# Patient Record
Sex: Male | Born: 1950 | Race: White | Hispanic: No | Marital: Married
Health system: Southern US, Community
[De-identification: ages and names within clinical notes are randomized; demographics above are authoritative.]

## PROBLEM LIST (undated history)

## (undated) DIAGNOSIS — I10 Essential (primary) hypertension: Secondary | ICD-10-CM

---

## 2020-11-23 ENCOUNTER — Other Ambulatory Visit: Payer: Self-pay

## 2020-11-23 ENCOUNTER — Emergency Department (HOSPITAL_COMMUNITY)
Admission: EM | Admit: 2020-11-23 | Discharge: 2020-11-23 | Disposition: A | Discharge status: Home / Self Care | Payer: Medicare HMO | Attending: Emergency Medicine | Admitting: Emergency Medicine

## 2020-11-23 ENCOUNTER — Emergency Department (HOSPITAL_COMMUNITY): Payer: Medicare HMO

## 2020-11-23 DIAGNOSIS — S0121XA Laceration without foreign body of nose, initial encounter: Secondary | ICD-10-CM | POA: Diagnosis not present

## 2020-11-23 DIAGNOSIS — S022XXA Fracture of nasal bones, initial encounter for closed fracture: Secondary | ICD-10-CM | POA: Insufficient documentation

## 2020-11-23 DIAGNOSIS — S0992XA Unspecified injury of nose, initial encounter: Secondary | ICD-10-CM | POA: Diagnosis present

## 2020-11-23 DIAGNOSIS — S0181XA Laceration without foreign body of other part of head, initial encounter: Secondary | ICD-10-CM

## 2020-11-23 MED ORDER — LIDOCAINE-EPINEPHRINE (PF) 2 %-1:200000 IJ SOLN
20.0000 mL | Freq: Once | INTRAMUSCULAR | Status: AC
  Administered 2020-11-23: 20 mL
  Filled 2020-11-23: qty 20

## 2020-11-23 NOTE — ED Provider Notes (Signed)
[facility] EMERGENCY DEPARTMENT Provider Note   CSN: [id number] Arrival date & time: 11/23/20  1551     History Chief Complaint  Patient presents with   Thrown from horse    Henry Cole is a 70 y.o. male.  Presents to ER after being thrown from horse.  Patient states that he is primarily having facial pain, nose pain.  Has noted some bleeding from inside his nose and top of his nose.  Not on blood thinners.  Denies loss of consciousness.  Has been ambulatory since accident.  Pain is currently mild to moderate.  No alleviating or aggravating factors.  HPI     No past medical history on file.  There are no problems to display for this patient.     No family history on file.     Home Medications Prior to Admission medications   Medication Sig Start Date End Date Taking? Authorizing Provider  Cholecalciferol 25 MCG (1000 UT) tablet Take 1 tablet by mouth at bedtime.   Yes [provider]  Cyanocobalamin 5000 MCG/ML LIQD Place 3 mLs under the tongue daily.   Yes [provider]  meloxicam (MOBIC) 15 MG tablet Take 15 mg by mouth at bedtime. 09/18/20  Yes [provider]  nadolol (CORGARD) 20 MG tablet Take 20 mg by mouth at bedtime. 06/30/20  Yes [provider]    Allergies    Patient has no known allergies.  Review of Systems   Review of Systems  Constitutional:  Negative for chills and fever.  HENT:  Positive for nosebleeds. Negative for ear pain and sore throat.   Eyes:  Negative for pain and visual disturbance.  Respiratory:  Negative for cough and shortness of breath.   Cardiovascular:  Negative for chest pain and palpitations.  Gastrointestinal:  Negative for abdominal pain and vomiting.  Genitourinary:  Negative for dysuria and hematuria.  Musculoskeletal:  Negative for arthralgias and back pain.  Skin:  Negative for color change and rash.  Neurological:  Positive for headaches. Negative for syncope.  All  other systems reviewed and are negative.  Physical Exam Updated Vital Signs BP (!) 146/74   Pulse 70   Temp 97.8 F (36.6 C) (Oral)   Resp 16   Ht 6\' 2"  (1.88 m)   Wt 104.3 kg   SpO2 97%   BMI 29.53 kg/m   Physical Exam Vitals and nursing note reviewed.  Constitutional:      Appearance: He is well-developed.  HENT:     Head: Normocephalic.     Nose:     Comments: Small dried blood in bilateral nares, no active bleeding Generalized swelling to nose, 2 cm laceration across bridge of nose Eyes:     Conjunctiva/sclera: Conjunctivae normal.  Cardiovascular:     Rate and Rhythm: Normal rate and regular rhythm.     Heart sounds: No murmur heard. Pulmonary:     Effort: Pulmonary effort is normal. No respiratory distress.     Breath sounds: Normal breath sounds.  Abdominal:     Palpations: Abdomen is soft.     Tenderness: There is no abdominal tenderness.  Musculoskeletal:     Cervical back: Neck supple.  Skin:    General: Skin is warm and dry.  Neurological:     Mental Status: He is alert.    ED Results / Procedures / Treatments   Labs (all labs ordered are listed, but only abnormal results are displayed) Labs Reviewed - No data to  display  EKG None  Radiology CT Head Wo Contrast  Result Date: 11/23/2020 CLINICAL DATA:  Thrown from horse with facial pain and headaches, initial encounter EXAM: CT HEAD WITHOUT CONTRAST CT MAXILLOFACIAL WITHOUT CONTRAST CT CERVICAL SPINE WITHOUT CONTRAST TECHNIQUE: Multidetector CT imaging of the head, cervical spine, and maxillofacial structures were performed using the standard protocol without intravenous contrast. Multiplanar CT image reconstructions of the cervical spine and maxillofacial structures were also generated. COMPARISON:  None. FINDINGS: CT HEAD FINDINGS Brain: No evidence of acute infarction, hemorrhage, hydrocephalus, extra-axial collection or mass lesion/mass effect. Cavum septum pellucidum is noted which is a normal  variant. Vascular: No hyperdense vessel or unexpected calcification. Skull: Normal. Negative for fracture or focal lesion. Other: Comminuted bilateral nasal bone fractures are noted with mild impaction. CT MAXILLOFACIAL FINDINGS Osseous: Comminuted nasal bone fractures are noted bilaterally with impaction and overlying soft tissue swelling. Mild fracture of the nasal septum is noted with slight deviation to the right. No orbital fracture or sinus wall fractures are seen. No other focal bony abnormality is noted. Orbits: Orbits and their contents are within normal limits. Sinuses: Paranasal sinuses demonstrate mucosal thickening within the ethmoid sinuses and opacification of the right maxillary antrum likely related to the recent injury. Soft tissues: Soft tissue swelling is noted over the nasal bridge. No other focal hematoma is seen. CT CERVICAL SPINE FINDINGS Alignment: Mild straightening of the normal cervical lordosis is noted. This is likely related to muscular spasm. Skull base and vertebrae: 7 cervical segments are well visualized. Vertebral body height is well maintained. Multilevel osteophytic changes are noted throughout the cervical spine. Disc space narrowing at C5-6 and C6-7 is noted. Facet hypertrophic changes are noted. No acute fracture or acute facet abnormality is noted. Soft tissues and spinal canal: Surrounding soft tissue structures show no acute abnormality. There is enlargement and heterogeneity of the left lobe of the thyroid without discrete nodule. There is some intrathoracic extension identified. The inferior most aspect is not included on this exam due to the imaging plane. Upper chest: Lung apices are within normal limits. Other: None IMPRESSION: CT of the head: No acute intracranial abnormality noted. CT of the maxillofacial bones: Comminuted nasal bone fractures bilaterally with fracture along the superior aspect of the nasal septum and mild deviation to the right. Opacification of  the right maxillary antrum is noted related to the recent injury. CT of the cervical spine: Multilevel degenerative change without acute abnormality. Enlargement with heterogeneity in the left lobe of the thyroid with intrathoracic extension. Recommend thyroid US (ref: J Am Coll Radiol. 2015 Feb;12(2): 143-50). Electronically Signed   By: Alcide Clever M.D.   On: 11/23/2020 17:04    Procedures .Marland KitchenLaceration Repair  Date/Time: 11/24/2020 12:16 PM Performed by: Milagros Loll, MD Authorized by: Milagros Loll, MD   Consent:    Consent obtained:  Verbal   Risks, benefits, and alternatives were discussed: yes     Risks discussed:  Infection, poor wound healing, poor cosmetic result, retained foreign body, pain and nerve damage   Alternatives discussed:  No treatment, observation and delayed treatment Universal protocol:    Immediately prior to procedure, a time out was called: yes   Anesthesia:    Anesthesia method:  Local infiltration   Local anesthetic:  Lidocaine 2% WITH epi Laceration details:    Location:  Face   Face location:  Nose   Length (cm):  2 Exploration:    Contaminated: no   Treatment:    Amount  of cleaning:  Extensive   Irrigation solution:  Sterile saline   Irrigation method:  Syringe Skin repair:    Repair method:  Sutures   Suture size:  5-0   Suture material:  Nylon   Suture technique:  Simple interrupted   Number of sutures:  2 Approximation:    Approximation:  Close Repair type:    Repair type:  Simple Post-procedure details:    Dressing:  Open (no dressing)   Procedure completion:  Tolerated well, no immediate complications   Medications Ordered in ED Medications  lidocaine-EPINEPHrine (XYLOCAINE W/EPI) 2 %-1:200000 (PF) injection 20 mL (20 mLs Infiltration Given 11/23/20 1702)    ED Course  I have reviewed the triage vital signs and the nursing notes.  Pertinent labs & imaging results that were available during my care of the patient were  reviewed by me and considered in my medical decision making (see chart for details).    MDM Rules/Calculators/A&P                          70 year old male presents to ER after facial injury from being thrown off horse.  On exam patient noted to have small laceration across the bridge of his nose.  CT imaging negative for any intracranial pathology.  Did demonstrate nasal bone fracture.  CT C-spine negative.  Did demonstrate heterogeneous thyroid.  Discussed with patient, recommended follow-up with primary doctor and request outpatient thyroid ultrasound.  For his nasal laceration, performed bedside repair.  Regarding the epistaxis.  This resolved with direct pressure.  Instructed patient to follow-up with ENT for further management of his nasal bone fracture.  Discussed patient may need additional procedures.  Recommended suture removal in approximately 1 week.  Discharged.   After the discussed management above, the patient was determined to be safe for discharge.  The patient was in agreement with this plan and all questions regarding their care were answered.  ED return precautions were discussed and the patient will return to the ED with any significant worsening of condition.  Final Clinical Impression(s) / ED Diagnoses Final diagnoses:  Closed fracture of nasal bone, initial encounter  Facial laceration, initial encounter    Rx / DC Orders ED Discharge Orders     None        Milagros Loll, MD 11/24/20 1217

## 2020-11-23 NOTE — ED Provider Notes (Signed)
Emergency Medicine Provider Triage Evaluation Note  Henry Cole , a 70 y.o. male  was evaluated in triage.  Pt complains of facial pain after being thrown off a horse just prior to arrival. Patient admits to hitting his nose causing bilateral epistaxis. No LOC. He admits to taking ASA 81mg  occasionally, last took it roughly 2 weeks ago. He endorses nose and upper lip pain. Last tetanus was within the past 5 years. He states after the accident, he was ambulatory at the scene. Denies low back pain, chest pain, nausea, vomiting, abdominal pain.  Review of Systems  Positive: wound Negative: Back pain  Physical Exam  BP (!) 167/93 (BP Location: Right Arm)   Pulse 72   Temp 97.8 F (36.6 C) (Oral)   Resp 16   SpO2 97%  Gen:   Awake, no distress   Resp:  Normal effort  MSK:   Moves extremities without difficulty  Other:  Laceration to nose with bilateral epistaxis. No hemotympanum. Equal grip strength. No cervical, thoracic, or lumbar midline tenderness.  Medical Decision Making  Medically screening exam initiated at 4:00 PM.  Appropriate orders placed.  Ruairi Stutsman was informed that the remainder of the evaluation will be completed by another provider, this initial triage assessment does not replace that evaluation, and the importance of remaining in the ED until their evaluation is complete.  CT head, maxillofacial, and cervical neck ordered CXR and pelvis x-ray ordered due to trauma and distracting injury and unable to perform full PE in triage.  Patient brought back to room 31 from triage.    Leane Call, PA-C 11/23/20 1604    11/25/20, DO 11/24/20 [phone]

## 2020-11-23 NOTE — ED Notes (Signed)
Patient transported to CT 

## 2020-11-23 NOTE — ED Triage Notes (Signed)
Pt thrown from horse when horse stumbled. Obvious deformity noted to nose, bleeding controled in triage. Pt not on thinners, denies LOC, ambulatory on scene & able to extricate self from car on arrival. Pt A&O4 on arrival, GCS 15. CMS intact in all 4 extremities, NAD in triage

## 2020-11-23 NOTE — Discharge Instructions (Addendum)
The radiologist recommends obtaining a thyroid ultrasound.  Your primary doctor can arrange this.  This is to ensure you do not have a thyroid mass.  Regarding your broken nose, please schedule follow-up with an ear nose and throat specialist.  You may need a procedure.  Your sutures on your nose should be removed in approximately 1 week.  You can have this done in the ENT office, primary care doctor or come back to ER.  For any nosebleeding, apply direct pressure.  If direct pressure does not stop the bleeding after 15 to 20 minutes, come back to ER for reassessment and further management.

## 2021-12-27 IMAGING — CT CT HEAD W/O CM
3 of 4 series · 13 of 47 positions shown, 15 images · non-contrast
Comparison: None.

CLINICAL DATA: Thrown from horse with facial pain and headaches,
initial encounter

EXAM:
CT HEAD WITHOUT CONTRAST
CT MAXILLOFACIAL WITHOUT CONTRAST
CT CERVICAL SPINE WITHOUT CONTRAST
TECHNIQUE: Multidetector CT imaging of the head, cervical spine, and
maxillofacial structures were performed using the standard protocol
without intravenous contrast. Multiplanar CT image reconstructions
of the cervical spine and maxillofacial structures were also
generated.

[Series 3: head without · axial · non-contrast · 0.44mm/px · z∈[-159,-34]mm · 7 of 35 slices shown, 9 images]
[im 5/35  brain]
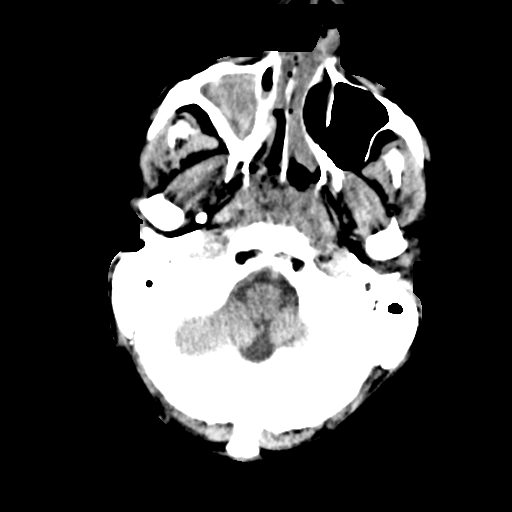
[im 5/35  bone]
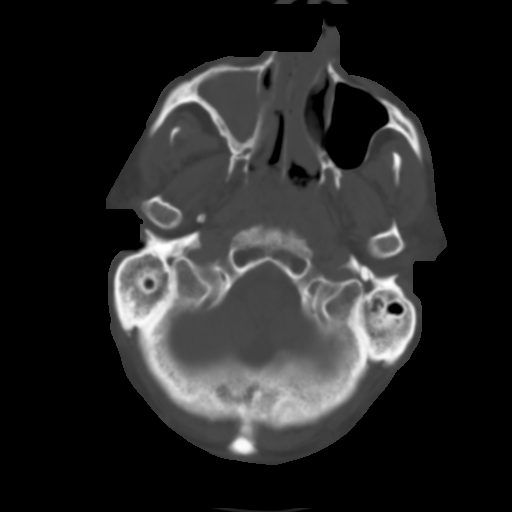
[im 9/35  brain]
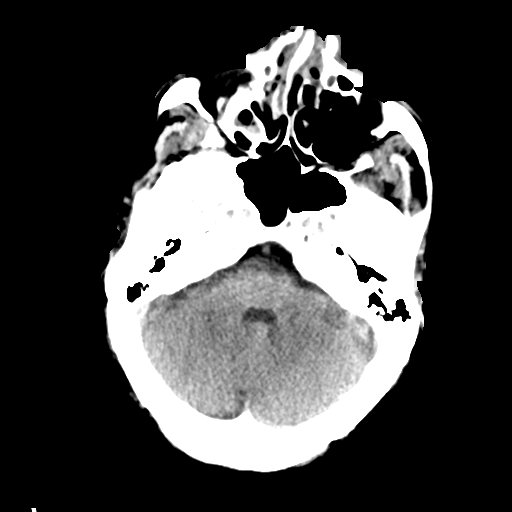
[im 13/35  brain]
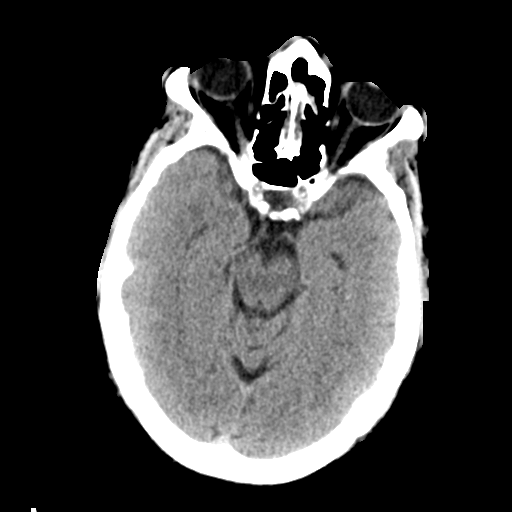
[im 18/35  brain]
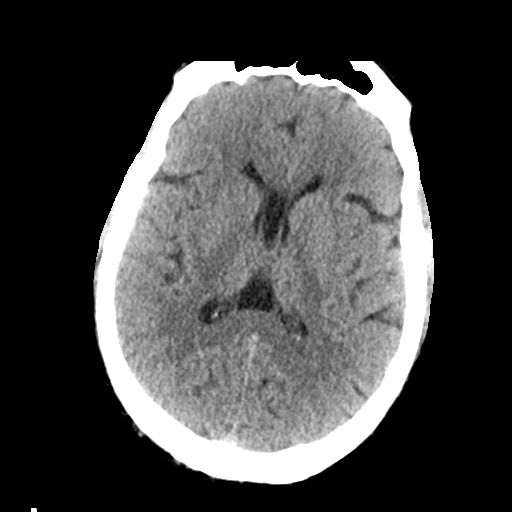
[im 22/35  brain]
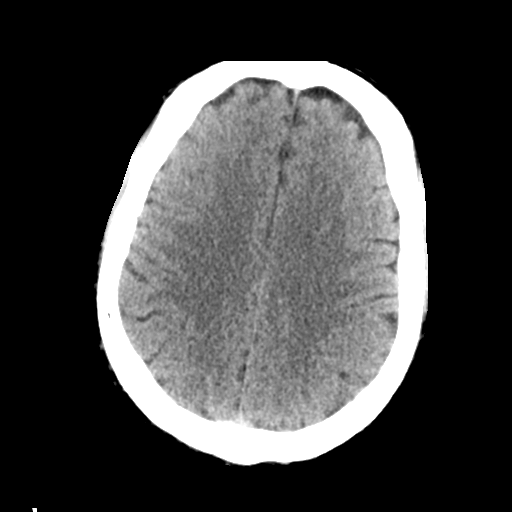
[im 22/35  bone]
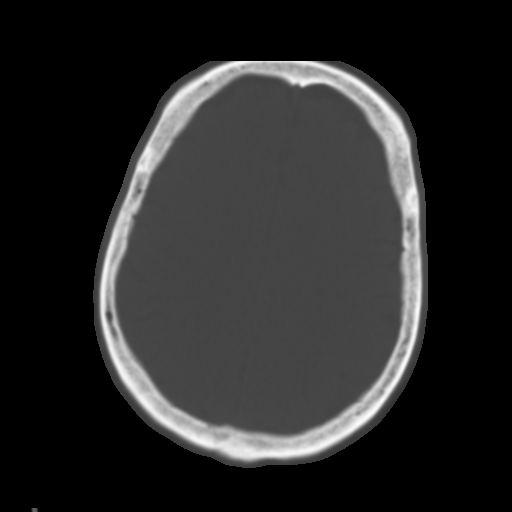
[im 26/35  brain]
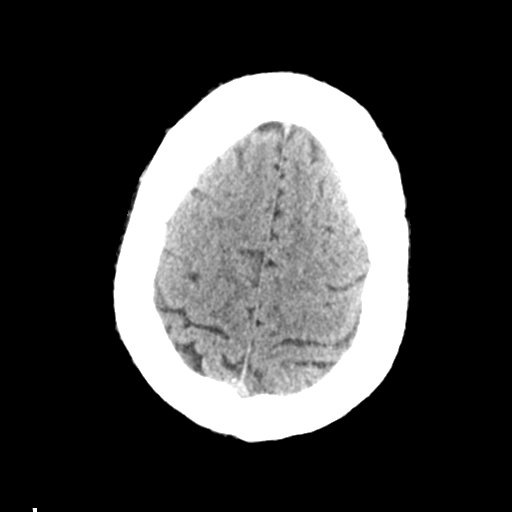
[im 30/35  brain]
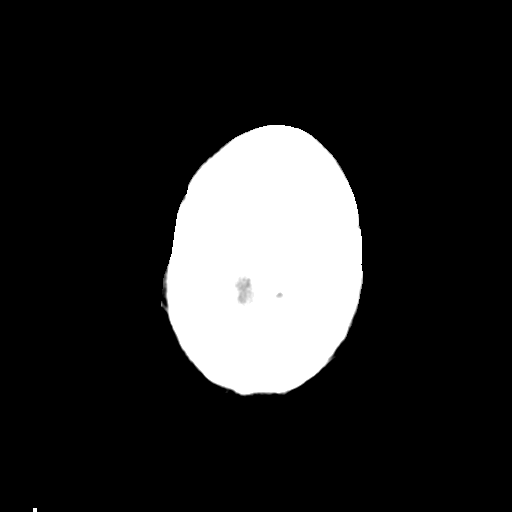

[Series 5: head without cor · coronal · non-contrast · 0.33mm/px · 3 of 75 slices shown]
[im 25/75  brain]
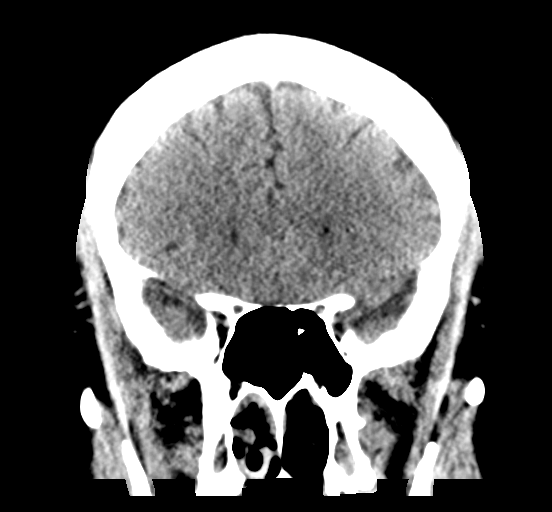
[im 33/75  brain]
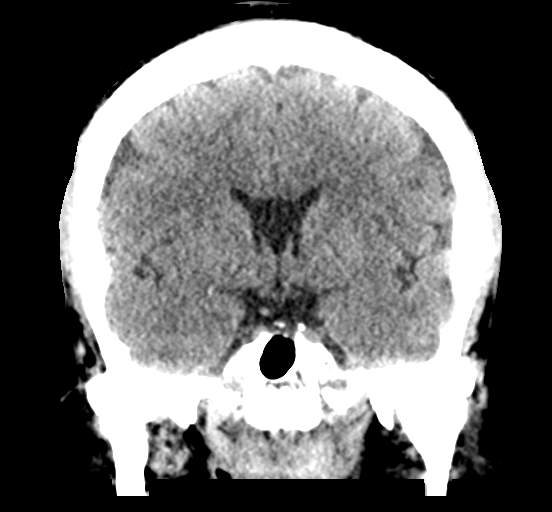
[im 42/75  brain]
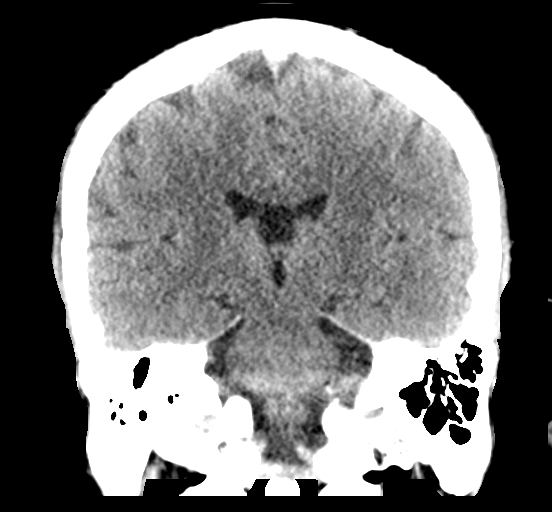

[Series 6: head without sag · sagittal · non-contrast · 0.33mm/px · 3 of 59 slices shown]
[im 20/59  brain]
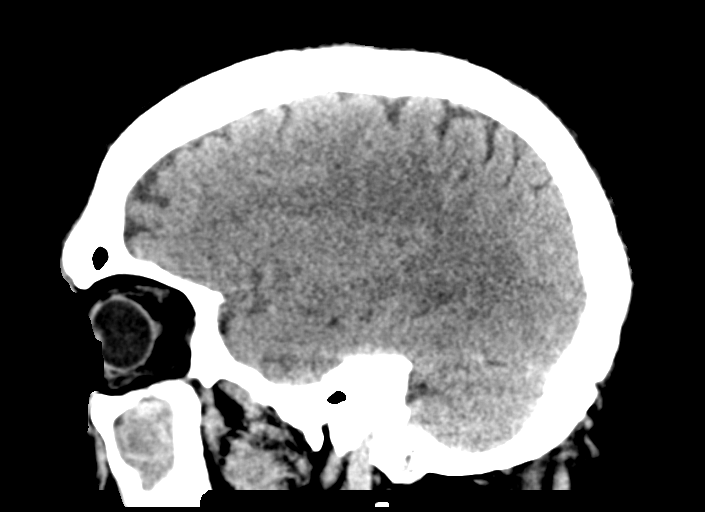
[im 30/59  brain]
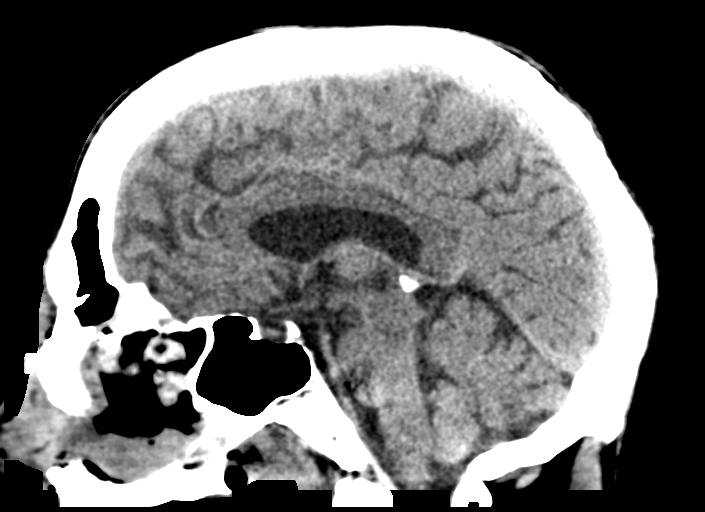
[im 39/59  brain]
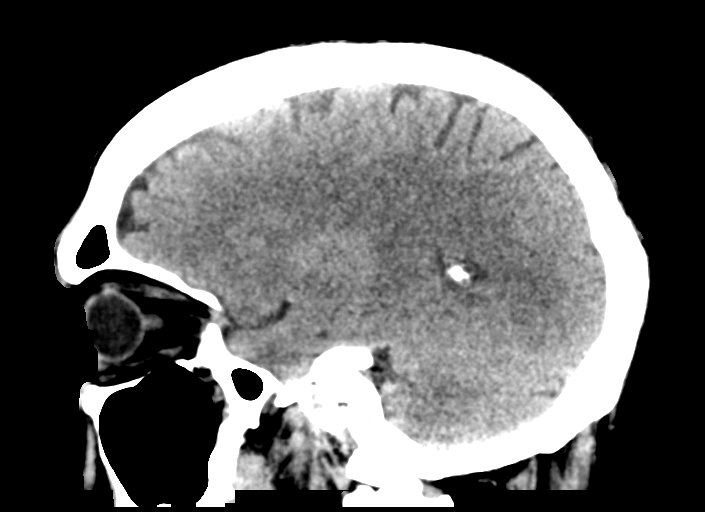

[13 of 47 positions shown; findings below may reference images not displayed]

FINDINGS: CT HEAD FINDINGS

Brain: No evidence of acute infarction, hemorrhage, hydrocephalus,
extra-axial collection or mass lesion/mass effect. Cavum septum
pellucidum is noted which is a normal variant.

Vascular: No hyperdense vessel or unexpected calcification.

Skull: Normal. Negative for fracture or focal lesion.

Other: Comminuted bilateral nasal bone fractures are noted with mild
impaction.

CT MAXILLOFACIAL FINDINGS

Osseous: Comminuted nasal bone fractures are noted bilaterally with
impaction and overlying soft tissue swelling. Mild fracture of the
nasal septum is noted with slight deviation to the right. No orbital
fracture or sinus wall fractures are seen. No other focal bony
abnormality is noted.

Orbits: Orbits and their contents are within normal limits.

Sinuses: Paranasal sinuses demonstrate mucosal thickening within the
ethmoid sinuses and opacification of the right maxillary antrum
likely related to the recent injury.

Soft tissues: Soft tissue swelling is noted over the nasal bridge.
No other focal hematoma is seen.

CT CERVICAL SPINE FINDINGS

Alignment: Mild straightening of the normal cervical lordosis is
noted. This is likely related to muscular spasm.

Skull base and vertebrae: 7 cervical segments are well visualized.
Vertebral body height is well maintained. Multilevel osteophytic
changes are noted throughout the cervical spine. Disc space
narrowing at C5-6 and C6-7 is noted. Facet hypertrophic changes are
noted. No acute fracture or acute facet abnormality is noted.

Soft tissues and spinal canal: Surrounding soft tissue structures
show no acute abnormality. There is enlargement and heterogeneity of
the left lobe of the thyroid without discrete nodule. There is some
intrathoracic extension identified. The inferior most aspect is not
included on this exam due to the imaging plane.

Upper chest: Lung apices are within normal limits.

Other: None
IMPRESSION: CT of the head: No acute intracranial abnormality noted.

CT of the maxillofacial bones: Comminuted nasal bone fractures
bilaterally with fracture along the superior aspect of the nasal
septum and mild deviation to the right.

Opacification of the right maxillary antrum is noted related to the
recent injury.

CT of the cervical spine: Multilevel degenerative change without
acute abnormality.

Enlargement with heterogeneity in the left lobe of the thyroid with
intrathoracic extension. Recommend thyroid US (ref: [HOSPITAL]. [DATE]): 143-50).

## 2023-05-04 ENCOUNTER — Emergency Department (HOSPITAL_COMMUNITY): Payer: Medicare HMO

## 2023-05-04 ENCOUNTER — Emergency Department (HOSPITAL_COMMUNITY)
Admission: EM | Admit: 2023-05-04 | Discharge: 2023-05-04 | Disposition: A | Discharge status: Home / Self Care | Payer: Medicare HMO | Attending: Emergency Medicine | Admitting: Emergency Medicine

## 2023-05-04 ENCOUNTER — Encounter (HOSPITAL_COMMUNITY): Payer: Self-pay | Admitting: Emergency Medicine

## 2023-05-04 ENCOUNTER — Other Ambulatory Visit: Payer: Self-pay

## 2023-05-04 DIAGNOSIS — S2020XA Contusion of thorax, unspecified, initial encounter: Secondary | ICD-10-CM

## 2023-05-04 DIAGNOSIS — W231XXA Caught, crushed, jammed, or pinched between stationary objects, initial encounter: Secondary | ICD-10-CM | POA: Insufficient documentation

## 2023-05-04 DIAGNOSIS — R079 Chest pain, unspecified: Secondary | ICD-10-CM | POA: Insufficient documentation

## 2023-05-04 DIAGNOSIS — R030 Elevated blood-pressure reading, without diagnosis of hypertension: Secondary | ICD-10-CM

## 2023-05-04 DIAGNOSIS — S300XXA Contusion of lower back and pelvis, initial encounter: Secondary | ICD-10-CM | POA: Diagnosis not present

## 2023-05-04 DIAGNOSIS — S3992XA Unspecified injury of lower back, initial encounter: Secondary | ICD-10-CM | POA: Diagnosis present

## 2023-05-04 DIAGNOSIS — S20219A Contusion of unspecified front wall of thorax, initial encounter: Secondary | ICD-10-CM | POA: Insufficient documentation

## 2023-05-04 DIAGNOSIS — M542 Cervicalgia: Secondary | ICD-10-CM | POA: Diagnosis not present

## 2023-05-04 DIAGNOSIS — R519 Headache, unspecified: Secondary | ICD-10-CM | POA: Diagnosis not present

## 2023-05-04 HISTORY — DX: Essential (primary) hypertension: I10

## 2023-05-04 LAB — COMPREHENSIVE METABOLIC PANEL
ALT: 37 U/L (ref 0–44)
AST: 28 U/L (ref 15–41)
Albumin: 3.6 g/dL (ref 3.5–5.0)
Alkaline Phosphatase: 71 U/L (ref 38–126)
Anion gap: 8 (ref 5–15)
BUN: 11 mg/dL (ref 8–23)
CO2: 25 mmol/L (ref 22–32)
Calcium: 9 mg/dL (ref 8.9–10.3)
Chloride: 102 mmol/L (ref 98–111)
Creatinine, Ser: 0.63 mg/dL (ref 0.61–1.24)
GFR, Estimated: >60 mL/min (ref >60)
Glucose, Bld: 148 mg/dL — ABNORMAL HIGH (ref 70–99)
Potassium: 4.2 mmol/L (ref 3.5–5.1)
Sodium: 135 mmol/L (ref 135–145)
Total Bilirubin: 1.1 mg/dL (ref <1.2)
Total Protein: 6.4 g/dL — ABNORMAL LOW (ref 6.5–8.1)

## 2023-05-04 LAB — I-STAT CG4 LACTIC ACID, ED: Lactic Acid, Venous: 0.8 mmol/L (ref 0.5–1.9)

## 2023-05-04 LAB — CBC
HCT: 49.1 % (ref 39.0–52.0)
Hemoglobin: 17.1 g/dL — ABNORMAL HIGH (ref 13.0–17.0)
MCH: 32.3 pg (ref 26.0–34.0)
MCHC: 34.8 g/dL (ref 30.0–36.0)
MCV: 92.8 fL (ref 80.0–100.0)
Platelets: 147 10*3/uL — ABNORMAL LOW (ref 150–400)
RBC: 5.29 MIL/uL (ref 4.22–5.81)
RDW: 12.2 % (ref 11.5–15.5)
WBC: 6.5 10*3/uL (ref 4.0–10.5)
nRBC: 0 % (ref 0.0–0.2)

## 2023-05-04 LAB — I-STAT CHEM 8, ED
BUN: 13 mg/dL (ref 8–23)
Calcium, Ion: 1.14 mmol/L — ABNORMAL LOW (ref 1.15–1.40)
Chloride: 102 mmol/L (ref 98–111)
Creatinine, Ser: 0.7 mg/dL (ref 0.61–1.24)
Glucose, Bld: 147 mg/dL — ABNORMAL HIGH (ref 70–99)
HCT: 49 % (ref 39.0–52.0)
Hemoglobin: 16.7 g/dL (ref 13.0–17.0)
Potassium: 4.2 mmol/L (ref 3.5–5.1)
Sodium: 137 mmol/L (ref 135–145)
TCO2: 26 mmol/L (ref 22–32)

## 2023-05-04 LAB — PROTIME-INR
INR: 1 (ref 0.8–1.2)
Prothrombin Time: 13.6 s (ref 11.4–15.2)

## 2023-05-04 LAB — CK: Total CK: 254 U/L (ref 49–397)

## 2023-05-04 MED ORDER — KETOROLAC TROMETHAMINE 15 MG/ML IJ SOLN
15.0000 mg | Freq: Once | INTRAMUSCULAR | Status: AC
  Administered 2023-05-04: 15 mg via INTRAVENOUS
  Filled 2023-05-04: qty 1

## 2023-05-04 MED ORDER — TRAMADOL HCL 50 MG PO TABS: 50.0000 mg | ORAL_TABLET | Freq: Four times a day (QID) | ORAL | 0 refills | Status: AC | PRN

## 2023-05-04 MED ORDER — ONDANSETRON HCL 4 MG/2ML IJ SOLN
4.0000 mg | Freq: Once | INTRAMUSCULAR | Status: AC
  Administered 2023-05-04: 4 mg via INTRAVENOUS
  Filled 2023-05-04: qty 2

## 2023-05-04 MED ORDER — HYDROMORPHONE HCL 1 MG/ML IJ SOLN
1.0000 mg | Freq: Once | INTRAMUSCULAR | Status: AC
  Administered 2023-05-04: 1 mg via INTRAVENOUS
  Filled 2023-05-04: qty 1

## 2023-05-04 MED ORDER — IOHEXOL 350 MG/ML SOLN
75.0000 mL | Freq: Once | INTRAVENOUS | Status: AC | PRN
  Administered 2023-05-04: 75 mL via INTRAVENOUS

## 2023-05-04 MED ORDER — METHOCARBAMOL 750 MG PO TABS
750.0000 mg | ORAL_TABLET | Freq: Three times a day (TID) | ORAL | 0 refills | Status: AC | PRN
Start: 2023-05-04 — End: ?

## 2023-05-04 NOTE — ED Notes (Signed)
Trauma Event Note  Nonactivated trauma- incident happened yesterday -- was moving large hay bales- large round bale rolled against him, pinning him against the stall door in the barn-- hurts to move, breathe, difficulty walking, but states it is from the pain in his right flank, upper back area. Tender over T-spine and right posterior ribs.  Orders started at triage.   Last imported Vital Signs BP (!) 154/104   Pulse 90   Temp 98 F (36.7 C) (Oral)   Resp 18   Ht 6\' 3"  (1.905 m)   Wt 247 lb (112 kg)   SpO2 96%   BMI 30.87 kg/m   Trending CBC No results for input(s): "WBC", "HGB", "HCT", "PLT" in the last 72 hours.  Trending Coag's No results for input(s): "APTT", "INR" in the last 72 hours.  Trending BMET No results for input(s): "NA", "K", "CL", "CO2", "BUN", "CREATININE", "GLUCOSE" in the last 72 hours.    Henry Cole  Trauma Response RN  Please call TRN at [phone] for further assistance.

## 2023-05-04 NOTE — ED Provider Notes (Signed)
[place] EMERGENCY DEPARTMENT AT [facility] Provider Note   CSN: [id number] Arrival date & time: 05/04/23  1305     History  Chief Complaint  Patient presents with   Trauma    Henry Cole is a 72 y.o. male.  Pt s/p injury to back and right flank yesterday. Indicates was knocked back into a stall door and latch by a large bale of hay. No loc. Was ambulatory since. C/o lower back and lower right flank pain posteriorly, constant, dull, severe, non radiating. No radicular pain. No saddle area or leg numbness. No weakness. No anterior pain, no chest pain or abd pain. No head injury or loc. No headache. No neck pain. No anticoagulant use.  Has tried otc meds without relief.   The history is provided by the patient, the spouse and medical records.  Trauma   Current symptoms:      Associated symptoms:            Reports back pain.            Denies chest pain, headache, nausea, neck pain and vomiting.       Home Medications Prior to Admission medications   Medication Sig Start Date End Date Taking? Authorizing Provider  Cholecalciferol 25 MCG (1000 UT) tablet Take 1 tablet by mouth at bedtime.    [provider]  Cyanocobalamin 5000 MCG/ML LIQD Place 3 mLs under the tongue daily.    [provider]  meloxicam (MOBIC) 15 MG tablet Take 15 mg by mouth at bedtime. 09/18/20   [provider]  nadolol (CORGARD) 20 MG tablet Take 20 mg by mouth at bedtime. 06/30/20   [provider]      Allergies    Patient has no known allergies.    Review of Systems   Review of Systems  Constitutional:  Negative for fever.  HENT:  Negative for nosebleeds.   Eyes:  Negative for pain and redness.  Respiratory:  Negative for shortness of breath.   Cardiovascular:  Negative for chest pain.  Gastrointestinal:  Negative for nausea and vomiting.  Genitourinary:  Positive for flank pain. Negative for dysuria and hematuria.  Musculoskeletal:  Positive  for back pain. Negative for neck pain.  Skin:  Negative for wound.  Neurological:  Negative for weakness, numbness and headaches.  Hematological:  Does not bruise/bleed easily.  Psychiatric/Behavioral:  Negative for confusion.     Physical Exam Updated Vital Signs BP 136/80   Pulse 63   Temp 98 F (36.7 C) (Oral)   Resp 19   Ht 1.905 m (6\' 3" )   Wt 112 kg   SpO2 97%   BMI 30.87 kg/m  Physical Exam Vitals and nursing note reviewed.  Constitutional:      Appearance: Normal appearance. He is well-developed.  HENT:     Head: Atraumatic.     Nose: Nose normal.     Mouth/Throat:     Mouth: Mucous membranes are moist.     Pharynx: Oropharynx is clear.  Eyes:     General: No scleral icterus.    Conjunctiva/sclera: Conjunctivae normal.     Pupils: Pupils are equal, round, and reactive to light.  Neck:     Trachea: No tracheal deviation.  Cardiovascular:     Rate and Rhythm: Normal rate and regular rhythm.     Pulses: Normal pulses.     Heart sounds: Normal heart sounds. No murmur heard.    No friction rub. No gallop.  Pulmonary:     Effort: Pulmonary effort is normal. No accessory muscle usage or respiratory distress.     Breath sounds: Normal breath sounds.  Chest:     Chest wall: No tenderness.  Abdominal:     General: Bowel sounds are normal. There is no distension.     Palpations: Abdomen is soft.     Tenderness: There is no abdominal tenderness.     Comments: No abd contusion or bruising.   Genitourinary:    Comments: No cva tenderness. Musculoskeletal:        General: No swelling.     Cervical back: Normal range of motion and neck supple. No rigidity or tenderness.     Comments: Mid to upper lumbar tenderness, and left lower back and flank tenderness, otherwise, CTLS spine, non tender, aligned, no step off. Good rom bil extremities without pain or focal bony tenderness.   Skin:    General: Skin is warm and dry.     Coloration: Jaundice: .qaz.     Findings: No  rash.  Neurological:     Mental Status: He is alert.     Comments: GCS 15. Alert, speech clear. Motor/sens grossly intact bil.   Psychiatric:        Mood and Affect: Mood normal.     ED Results / Procedures / Treatments   Labs (all labs ordered are listed, but only abnormal results are displayed) Results for orders placed or performed during the hospital encounter of 05/04/23  Comprehensive metabolic panel   Collection Time: 05/04/23  1:42 PM  Result Value Ref Range   Sodium 135 135 - 145 mmol/L   Potassium 4.2 3.5 - 5.1 mmol/L   Chloride 102 98 - 111 mmol/L   CO2 25 22 - 32 mmol/L   Glucose, Bld 148 (H) 70 - 99 mg/dL   BUN 11 8 - 23 mg/dL   Creatinine, Ser 6.96 0.61 - 1.24 mg/dL   Calcium 9.0 8.9 - 29.5 mg/dL   Total Protein 6.4 (L) 6.5 - 8.1 g/dL   Albumin 3.6 3.5 - 5.0 g/dL   AST 28 15 - 41 U/L   ALT 37 0 - 44 U/L   Alkaline Phosphatase 71 38 - 126 U/L   Total Bilirubin 1.1 <1.2 mg/dL   GFR, Estimated >28 >41 mL/min   Anion gap 8 5 - 15  CBC   Collection Time: 05/04/23  1:42 PM  Result Value Ref Range   WBC 6.5 4.0 - 10.5 K/uL   RBC 5.29 4.22 - 5.81 MIL/uL   Hemoglobin 17.1 (H) 13.0 - 17.0 g/dL   HCT 32.4 40.1 - 02.7 %   MCV 92.8 80.0 - 100.0 fL   MCH 32.3 26.0 - 34.0 pg   MCHC 34.8 30.0 - 36.0 g/dL   RDW 25.3 66.4 - 40.3 %   Platelets 147 (L) 150 - 400 K/uL   nRBC 0.0 0.0 - 0.2 %  Protime-INR   Collection Time: 05/04/23  1:42 PM  Result Value Ref Range   Prothrombin Time 13.6 11.4 - 15.2 seconds   INR 1.0 0.8 - 1.2  CK   Collection Time: 05/04/23  1:42 PM  Result Value Ref Range   Total CK 254 49 - 397 U/L  I-Stat Lactic Acid, ED   Collection Time: 05/04/23  1:49 PM  Result Value Ref Range   Lactic Acid, Venous 0.8 0.5 - 1.9 mmol/L  I-Stat Chem 8, ED   Collection Time: 05/04/23  1:53 PM  Result  Value Ref Range   Sodium 137 135 - 145 mmol/L   Potassium 4.2 3.5 - 5.1 mmol/L   Chloride 102 98 - 111 mmol/L   BUN 13 8 - 23 mg/dL   Creatinine, Ser 0.27  0.61 - 1.24 mg/dL   Glucose, Bld 253 (H) 70 - 99 mg/dL   Calcium, Ion 6.64 (L) 1.15 - 1.40 mmol/L   TCO2 26 22 - 32 mmol/L   Hemoglobin 16.7 13.0 - 17.0 g/dL   HCT 40.3 47.4 - 25.9 %   CT CHEST ABDOMEN PELVIS W CONTRAST Result Date: 05/04/2023 CLINICAL DATA:  Crushing injury yesterday, back and rib pain EXAM: CT CHEST, ABDOMEN, AND PELVIS WITH CONTRAST CT LUMBAR SPINE WITH CONTRAST TECHNIQUE: Multidetector CT imaging of the chest, abdomen and pelvis was performed following the standard protocol during bolus administration of intravenous contrast. Multidetector CT imaging of the lumbar spine was performed following the standard protocol during bolus administration of intravenous contrast. RADIATION DOSE REDUCTION: This exam was performed according to the departmental dose-optimization program which includes automated exposure control, adjustment of the mA and/or kV according to patient size and/or use of iterative reconstruction technique. CONTRAST:  75mL OMNIPAQUE IOHEXOL 350 MG/ML SOLN COMPARISON:  None Available. FINDINGS: CT CHEST FINDINGS Cardiovascular: No significant vascular findings. Normal heart size. Three-vessel coronary artery calcifications no pericardial effusion. Mediastinum/Nodes: No enlarged mediastinal, hilar, or axillary lymph nodes. Trachea and esophagus demonstrate no significant findings. Lungs/Pleura: Scarring and or atelectasis of the left-greater-than-right lung bases with elevation of the left hemidiaphragm. Musculoskeletal: No chest wall mass or suspicious osseous lesions identified. CT ABDOMEN PELVIS FINDINGS Hepatobiliary: No solid liver abnormality is seen. Simple liver cysts, for which no further follow-up or characterization is required. No gallstones, gallbladder wall thickening, or biliary dilatation. Pancreas: Unremarkable. No pancreatic ductal dilatation or surrounding inflammatory changes. Spleen: Normal in size without significant abnormality. Adrenals/Urinary Tract:  Adrenal glands are unremarkable. Simple, benign left renal cortical cysts, for which no further follow-up or characterization is required. Nonobstructive left renal calculi. Bladder is unremarkable. Stomach/Bowel: Stomach is within normal limits. Appendix appears normal. No evidence of bowel wall thickening, distention, or inflammatory changes. Vascular/Lymphatic: Scattered aortic atherosclerosis. No enlarged abdominal or pelvic lymph nodes. Reproductive: No mass or other abnormality. Other: Small fat containing bilateral inguinal hernias.  No ascites. Musculoskeletal: No acute osseous findings. CT LUMBAR SPINE FINDINGS Alignment: Degenerative straightening of the normal lumbar lordosis. Vertebral bodies: Intact. No fracture or dislocation. Disc spaces: Moderate disc space height loss and osteophytosis throughout the lumbar spine. Paraspinous soft tissues: Unremarkable. IMPRESSION: 1. No CT evidence of acute traumatic injury to the chest, abdomen, or pelvis. 2. No fracture or dislocation of the lumbar spine. 3. Nonobstructive left nephrolithiasis. 4. Coronary artery disease. Aortic Atherosclerosis (ICD10-I70.0). Electronically Signed   By: Jearld Lesch M.D.   On: 05/04/2023 16:11   CT L-SPINE NO CHARGE Result Date: 05/04/2023 CLINICAL DATA:  Crushing injury yesterday, back and rib pain EXAM: CT CHEST, ABDOMEN, AND PELVIS WITH CONTRAST CT LUMBAR SPINE WITH CONTRAST TECHNIQUE: Multidetector CT imaging of the chest, abdomen and pelvis was performed following the standard protocol during bolus administration of intravenous contrast. Multidetector CT imaging of the lumbar spine was performed following the standard protocol during bolus administration of intravenous contrast. RADIATION DOSE REDUCTION: This exam was performed according to the departmental dose-optimization program which includes automated exposure control, adjustment of the mA and/or kV according to patient size and/or use of iterative reconstruction  technique. CONTRAST:  75mL OMNIPAQUE IOHEXOL 350 MG/ML SOLN  COMPARISON:  None Available. FINDINGS: CT CHEST FINDINGS Cardiovascular: No significant vascular findings. Normal heart size. Three-vessel coronary artery calcifications no pericardial effusion. Mediastinum/Nodes: No enlarged mediastinal, hilar, or axillary lymph nodes. Trachea and esophagus demonstrate no significant findings. Lungs/Pleura: Scarring and or atelectasis of the left-greater-than-right lung bases with elevation of the left hemidiaphragm. Musculoskeletal: No chest wall mass or suspicious osseous lesions identified. CT ABDOMEN PELVIS FINDINGS Hepatobiliary: No solid liver abnormality is seen. Simple liver cysts, for which no further follow-up or characterization is required. No gallstones, gallbladder wall thickening, or biliary dilatation. Pancreas: Unremarkable. No pancreatic ductal dilatation or surrounding inflammatory changes. Spleen: Normal in size without significant abnormality. Adrenals/Urinary Tract: Adrenal glands are unremarkable. Simple, benign left renal cortical cysts, for which no further follow-up or characterization is required. Nonobstructive left renal calculi. Bladder is unremarkable. Stomach/Bowel: Stomach is within normal limits. Appendix appears normal. No evidence of bowel wall thickening, distention, or inflammatory changes. Vascular/Lymphatic: Scattered aortic atherosclerosis. No enlarged abdominal or pelvic lymph nodes. Reproductive: No mass or other abnormality. Other: Small fat containing bilateral inguinal hernias.  No ascites. Musculoskeletal: No acute osseous findings. CT LUMBAR SPINE FINDINGS Alignment: Degenerative straightening of the normal lumbar lordosis. Vertebral bodies: Intact. No fracture or dislocation. Disc spaces: Moderate disc space height loss and osteophytosis throughout the lumbar spine. Paraspinous soft tissues: Unremarkable. IMPRESSION: 1. No CT evidence of acute traumatic injury to the chest,  abdomen, or pelvis. 2. No fracture or dislocation of the lumbar spine. 3. Nonobstructive left nephrolithiasis. 4. Coronary artery disease. Aortic Atherosclerosis (ICD10-I70.0). Electronically Signed   By: Jearld Lesch M.D.   On: 05/04/2023 16:11   DG Pelvis 1-2 Views Result Date: 05/04/2023 CLINICAL DATA:  Crush injury.  Pain. EXAM: PELVIS - 1-2 VIEW COMPARISON:  None Available. FINDINGS: There is no evidence of pelvic fracture or diastasis. No pelvic bone lesions are seen. Degenerative changes noted lower lumbar spine. IMPRESSION: Negative. Electronically Signed   By: Kennith Center M.D.   On: 05/04/2023 14:33   DG Chest 1 View Result Date: 05/04/2023 CLINICAL DATA:  [id number] Pain [id number], barn door crush injury yesterday EXAM: CHEST  1 VIEW COMPARISON:  11/23/2020 chest radiograph. FINDINGS: Chronic prominent elevation of the left hemidiaphragm. Stable cardiomediastinal silhouette with normal heart size. No pneumothorax. No pleural effusion. No pulmonary edema. Mild passive left lung base atelectasis. No acute consolidative airspace disease. No displaced fractures in the visualized chest. IMPRESSION: Chronic prominent elevation of the left hemidiaphragm with mild passive left lung base atelectasis. Otherwise no acute cardiopulmonary disease. Electronically Signed   By: Delbert Phenix M.D.   On: 05/04/2023 14:32    EKG None  Radiology CT CHEST ABDOMEN PELVIS W CONTRAST Result Date: 05/04/2023 CLINICAL DATA:  Crushing injury yesterday, back and rib pain EXAM: CT CHEST, ABDOMEN, AND PELVIS WITH CONTRAST CT LUMBAR SPINE WITH CONTRAST TECHNIQUE: Multidetector CT imaging of the chest, abdomen and pelvis was performed following the standard protocol during bolus administration of intravenous contrast. Multidetector CT imaging of the lumbar spine was performed following the standard protocol during bolus administration of intravenous contrast. RADIATION DOSE REDUCTION: This exam was performed according to the  departmental dose-optimization program which includes automated exposure control, adjustment of the mA and/or kV according to patient size and/or use of iterative reconstruction technique. CONTRAST:  75mL OMNIPAQUE IOHEXOL 350 MG/ML SOLN COMPARISON:  None Available. FINDINGS: CT CHEST FINDINGS Cardiovascular: No significant vascular findings. Normal heart size. Three-vessel coronary artery calcifications no pericardial effusion. Mediastinum/Nodes: No enlarged mediastinal, hilar, or axillary lymph  nodes. Trachea and esophagus demonstrate no significant findings. Lungs/Pleura: Scarring and or atelectasis of the left-greater-than-right lung bases with elevation of the left hemidiaphragm. Musculoskeletal: No chest wall mass or suspicious osseous lesions identified. CT ABDOMEN PELVIS FINDINGS Hepatobiliary: No solid liver abnormality is seen. Simple liver cysts, for which no further follow-up or characterization is required. No gallstones, gallbladder wall thickening, or biliary dilatation. Pancreas: Unremarkable. No pancreatic ductal dilatation or surrounding inflammatory changes. Spleen: Normal in size without significant abnormality. Adrenals/Urinary Tract: Adrenal glands are unremarkable. Simple, benign left renal cortical cysts, for which no further follow-up or characterization is required. Nonobstructive left renal calculi. Bladder is unremarkable. Stomach/Bowel: Stomach is within normal limits. Appendix appears normal. No evidence of bowel wall thickening, distention, or inflammatory changes. Vascular/Lymphatic: Scattered aortic atherosclerosis. No enlarged abdominal or pelvic lymph nodes. Reproductive: No mass or other abnormality. Other: Small fat containing bilateral inguinal hernias.  No ascites. Musculoskeletal: No acute osseous findings. CT LUMBAR SPINE FINDINGS Alignment: Degenerative straightening of the normal lumbar lordosis. Vertebral bodies: Intact. No fracture or dislocation. Disc spaces: Moderate  disc space height loss and osteophytosis throughout the lumbar spine. Paraspinous soft tissues: Unremarkable. IMPRESSION: 1. No CT evidence of acute traumatic injury to the chest, abdomen, or pelvis. 2. No fracture or dislocation of the lumbar spine. 3. Nonobstructive left nephrolithiasis. 4. Coronary artery disease. Aortic Atherosclerosis (ICD10-I70.0). Electronically Signed   By: Jearld Lesch M.D.   On: 05/04/2023 16:11   CT L-SPINE NO CHARGE Result Date: 05/04/2023 CLINICAL DATA:  Crushing injury yesterday, back and rib pain EXAM: CT CHEST, ABDOMEN, AND PELVIS WITH CONTRAST CT LUMBAR SPINE WITH CONTRAST TECHNIQUE: Multidetector CT imaging of the chest, abdomen and pelvis was performed following the standard protocol during bolus administration of intravenous contrast. Multidetector CT imaging of the lumbar spine was performed following the standard protocol during bolus administration of intravenous contrast. RADIATION DOSE REDUCTION: This exam was performed according to the departmental dose-optimization program which includes automated exposure control, adjustment of the mA and/or kV according to patient size and/or use of iterative reconstruction technique. CONTRAST:  75mL OMNIPAQUE IOHEXOL 350 MG/ML SOLN COMPARISON:  None Available. FINDINGS: CT CHEST FINDINGS Cardiovascular: No significant vascular findings. Normal heart size. Three-vessel coronary artery calcifications no pericardial effusion. Mediastinum/Nodes: No enlarged mediastinal, hilar, or axillary lymph nodes. Trachea and esophagus demonstrate no significant findings. Lungs/Pleura: Scarring and or atelectasis of the left-greater-than-right lung bases with elevation of the left hemidiaphragm. Musculoskeletal: No chest wall mass or suspicious osseous lesions identified. CT ABDOMEN PELVIS FINDINGS Hepatobiliary: No solid liver abnormality is seen. Simple liver cysts, for which no further follow-up or characterization is required. No gallstones,  gallbladder wall thickening, or biliary dilatation. Pancreas: Unremarkable. No pancreatic ductal dilatation or surrounding inflammatory changes. Spleen: Normal in size without significant abnormality. Adrenals/Urinary Tract: Adrenal glands are unremarkable. Simple, benign left renal cortical cysts, for which no further follow-up or characterization is required. Nonobstructive left renal calculi. Bladder is unremarkable. Stomach/Bowel: Stomach is within normal limits. Appendix appears normal. No evidence of bowel wall thickening, distention, or inflammatory changes. Vascular/Lymphatic: Scattered aortic atherosclerosis. No enlarged abdominal or pelvic lymph nodes. Reproductive: No mass or other abnormality. Other: Small fat containing bilateral inguinal hernias.  No ascites. Musculoskeletal: No acute osseous findings. CT LUMBAR SPINE FINDINGS Alignment: Degenerative straightening of the normal lumbar lordosis. Vertebral bodies: Intact. No fracture or dislocation. Disc spaces: Moderate disc space height loss and osteophytosis throughout the lumbar spine. Paraspinous soft tissues: Unremarkable. IMPRESSION: 1. No CT evidence of acute traumatic injury  to the chest, abdomen, or pelvis. 2. No fracture or dislocation of the lumbar spine. 3. Nonobstructive left nephrolithiasis. 4. Coronary artery disease. Aortic Atherosclerosis (ICD10-I70.0). Electronically Signed   By: Jearld Lesch M.D.   On: 05/04/2023 16:11   DG Pelvis 1-2 Views Result Date: 05/04/2023 CLINICAL DATA:  Crush injury.  Pain. EXAM: PELVIS - 1-2 VIEW COMPARISON:  None Available. FINDINGS: There is no evidence of pelvic fracture or diastasis. No pelvic bone lesions are seen. Degenerative changes noted lower lumbar spine. IMPRESSION: Negative. Electronically Signed   By: Kennith Center M.D.   On: 05/04/2023 14:33   DG Chest 1 View Result Date: 05/04/2023 CLINICAL DATA:  [id number] Pain [id number], barn door crush injury yesterday EXAM: CHEST  1 VIEW COMPARISON:   11/23/2020 chest radiograph. FINDINGS: Chronic prominent elevation of the left hemidiaphragm. Stable cardiomediastinal silhouette with normal heart size. No pneumothorax. No pleural effusion. No pulmonary edema. Mild passive left lung base atelectasis. No acute consolidative airspace disease. No displaced fractures in the visualized chest. IMPRESSION: Chronic prominent elevation of the left hemidiaphragm with mild passive left lung base atelectasis. Otherwise no acute cardiopulmonary disease. Electronically Signed   By: Delbert Phenix M.D.   On: 05/04/2023 14:32    Procedures Procedures    Medications Ordered in ED Medications  HYDROmorphone (DILAUDID) injection 1 mg (has no administration in time range)  ketorolac (TORADOL) 15 MG/ML injection 15 mg (has no administration in time range)  HYDROmorphone (DILAUDID) injection 1 mg (1 mg Intravenous Given 05/04/23 1509)  ondansetron (ZOFRAN) injection 4 mg (4 mg Intravenous Given 05/04/23 1509)  iohexol (OMNIPAQUE) 350 MG/ML injection 75 mL (75 mLs Intravenous Contrast Given 05/04/23 1521)    ED Course/ Medical Decision Making/ A&P                                 Medical Decision Making Problems Addressed: Contusion of lower back, initial encounter: acute illness or injury with systemic symptoms that poses a threat to life or bodily functions Contusion of trunk, initial encounter: acute illness or injury with systemic symptoms that poses a threat to life or bodily functions  Amount and/or Complexity of Data Reviewed Independent Historian: spouse    Details: hx External Data Reviewed: notes. Labs: ordered. Decision-making details documented in ED Course. Radiology: ordered and independent interpretation performed. Decision-making details documented in ED Course.  Risk Prescription drug management. Parenteral controlled substances. Decision regarding hospitalization.   Iv ns. Continuous pulse ox and cardiac monitoring. Labs ordered/sent.  Imaging ordered.   Differential diagnosis includes back fracture, internal injury, contusion, etc. Dispo decision including potential need for admission considered - will get labs and imaging and reassess.   Reviewed nursing notes and prior charts for additional history. External reports reviewed. Additional history from: family.   Dilaudid iv. Zofran iv.   Cardiac monitor: sinus rhythm, rate 90.  Labs reviewed/interpreted by me - wbc and hct normal. Chem unremarkable.   Xrays reviewed/interpreted by me - no fx.  CT reviewed/interpreted by me - no fx.   Pain improved. Pt appears stable for d/c.   Rec close pcp f/u.  Return precautions provided.          Final Clinical Impression(s) / ED Diagnoses Final diagnoses:  None    Rx / DC Orders ED Discharge Orders     None         Cathren Laine, MD 05/04/23 1728

## 2023-05-04 NOTE — ED Provider Triage Note (Signed)
Emergency Medicine Provider Triage Evaluation Note  Henry Cole , a 72 y.o. male  was evaluated in triage.  Pt complains of injury.  Yesterday patient was crushed by a large bale of hay against the barn door.  Now complaining of pain primarily to his right lower back, chest and body along with mild shortness of breath.  No loss of consciousness.  Review of Systems  Positive: As above Negative: As above  Physical Exam  BP (!) 154/104   Pulse 90   Temp 98 F (36.7 C) (Oral)   Resp 18   Ht 6\' 3"  (1.905 m)   Wt 112 kg   SpO2 96%   BMI 30.87 kg/m  Gen:   Awake, no distress   Resp:  Normal effort  MSK:   Moves extremities without difficulty  Other:    Medical Decision Making  Medically screening exam initiated at 1:34 PM.  Appropriate orders placed.  Henry Cole was informed that the remainder of the evaluation will be completed by another provider, this initial triage assessment does not replace that evaluation, and the importance of remaining in the ED until their evaluation is complete.     Fayrene Helper, PA-C 05/04/23 1342

## 2023-05-04 NOTE — Discharge Instructions (Addendum)
It was our pleasure to provide your ER care today - we hope that you feel better.  Your xrays were read as showing no acute fracture or traumatic injury.  Incidental note was made of coronary artery disease and a left sided kidney stone - discuss with your doctor at follow up with them.   Avoid bending at waist or heavy lifting > 20 lbs for the next week.  Try gentle massage and/or heat  therapy to sore area. Take acetaminophen or ibuprofen as need for pain. You may also take ultram as need for pain, and robaxin as need for muscle pain/spasm - no driving when taking ultram or robaxin.   Follow up with primary care doctor in the next 1-2 weeks if symptoms fail to improve/resolve. Also follow up with primary care doctor regarding your blood pressure that is high today.   Return to ER if worse, new symptoms, fevers, severe/intractable pain, numbness/weakness, or other concern.    You were given pain meds in the ER - no driving for the next 8 hours.

## 2023-05-04 NOTE — ED Triage Notes (Signed)
Pt crushed in between barn stall door and a bale of hay yesterday. Pt complains of mid thoracic back pain radiates to right side around ribs. Hurts to move,  worse with laughing and taking deep breath. Pt denies any loss of bowel or bladder control. Pt denies hitting head. Denies LOC. Denies pain in legs.
